# Patient Record
Sex: Female | Born: 1950 | Hispanic: No | Smoking: Never smoker
Health system: Southern US, Community
[De-identification: ages and names within clinical notes are randomized; demographics above are authoritative.]

## PROBLEM LIST (undated history)

## (undated) DIAGNOSIS — F32A Depression, unspecified: Secondary | ICD-10-CM

## (undated) DIAGNOSIS — I10 Essential (primary) hypertension: Secondary | ICD-10-CM

## (undated) DIAGNOSIS — K219 Gastro-esophageal reflux disease without esophagitis: Secondary | ICD-10-CM

## (undated) DIAGNOSIS — F329 Major depressive disorder, single episode, unspecified: Secondary | ICD-10-CM

## (undated) DIAGNOSIS — F319 Bipolar disorder, unspecified: Secondary | ICD-10-CM

## (undated) HISTORY — PX: REPLACEMENT TOTAL KNEE: SUR1224

---

## 2000-12-20 ENCOUNTER — Ambulatory Visit (HOSPITAL_COMMUNITY): Admission: RE | Admit: 2000-12-20 | Discharge: 2000-12-20 | Payer: Self-pay | Admitting: *Deleted

## 2000-12-20 ENCOUNTER — Encounter: Payer: Self-pay | Admitting: *Deleted

## 2000-12-22 ENCOUNTER — Emergency Department (HOSPITAL_COMMUNITY): Admission: EM | Admit: 2000-12-22 | Discharge: 2000-12-22 | Payer: Self-pay | Admitting: Emergency Medicine

## 2016-09-03 ENCOUNTER — Emergency Department (HOSPITAL_COMMUNITY): Payer: Medicare Other

## 2016-09-03 ENCOUNTER — Encounter (HOSPITAL_COMMUNITY): Payer: Self-pay | Admitting: Nurse Practitioner

## 2016-09-03 ENCOUNTER — Emergency Department (HOSPITAL_COMMUNITY)
Admission: EM | Admit: 2016-09-03 | Discharge: 2016-09-03 | Disposition: A | Discharge status: Home / Self Care | Payer: Medicare Other | Attending: Emergency Medicine | Admitting: Emergency Medicine

## 2016-09-03 DIAGNOSIS — Y999 Unspecified external cause status: Secondary | ICD-10-CM | POA: Insufficient documentation

## 2016-09-03 DIAGNOSIS — W19XXXA Unspecified fall, initial encounter: Secondary | ICD-10-CM

## 2016-09-03 DIAGNOSIS — Z91040 Latex allergy status: Secondary | ICD-10-CM | POA: Insufficient documentation

## 2016-09-03 DIAGNOSIS — S0083XA Contusion of other part of head, initial encounter: Secondary | ICD-10-CM | POA: Diagnosis not present

## 2016-09-03 DIAGNOSIS — W0110XA Fall on same level from slipping, tripping and stumbling with subsequent striking against unspecified object, initial encounter: Secondary | ICD-10-CM | POA: Insufficient documentation

## 2016-09-03 DIAGNOSIS — Y939 Activity, unspecified: Secondary | ICD-10-CM | POA: Diagnosis not present

## 2016-09-03 DIAGNOSIS — S8001XA Contusion of right knee, initial encounter: Secondary | ICD-10-CM | POA: Diagnosis not present

## 2016-09-03 DIAGNOSIS — S8991XA Unspecified injury of right lower leg, initial encounter: Secondary | ICD-10-CM | POA: Diagnosis present

## 2016-09-03 DIAGNOSIS — Z96653 Presence of artificial knee joint, bilateral: Secondary | ICD-10-CM | POA: Diagnosis not present

## 2016-09-03 DIAGNOSIS — Y92512 Supermarket, store or market as the place of occurrence of the external cause: Secondary | ICD-10-CM | POA: Insufficient documentation

## 2016-09-03 HISTORY — DX: Gastro-esophageal reflux disease without esophagitis: K21.9

## 2016-09-03 HISTORY — DX: Bipolar disorder, unspecified: F31.9

## 2016-09-03 MED ORDER — OXYCODONE-ACETAMINOPHEN 5-325 MG PO TABS
1.0000 | ORAL_TABLET | Freq: Once | ORAL | Status: AC
  Administered 2016-09-03: 1 via ORAL
  Filled 2016-09-03: qty 1

## 2016-09-03 MED ORDER — LORAZEPAM 0.5 MG PO TABS
0.5000 mg | ORAL_TABLET | Freq: Once | ORAL | Status: AC
  Administered 2016-09-03: 0.5 mg via ORAL
  Filled 2016-09-03: qty 1

## 2016-09-03 MED ORDER — HYDROCODONE-ACETAMINOPHEN 5-325 MG PO TABS
1.0000 | ORAL_TABLET | Freq: Once | ORAL | Status: AC
  Administered 2016-09-03: 1 via ORAL
  Filled 2016-09-03: qty 1

## 2016-09-03 MED ORDER — ONDANSETRON 8 MG PO TBDP
8.0000 mg | ORAL_TABLET | Freq: Once | ORAL | Status: AC
  Administered 2016-09-03: 8 mg via ORAL
  Filled 2016-09-03: qty 1

## 2016-09-03 NOTE — ED Notes (Signed)
Bed: [facility] Expected date:  Expected time:  Means of arrival:  Comments: 

## 2016-09-03 NOTE — ED Triage Notes (Signed)
Pt reportedly had a mechanical fall injuring her back and right knee, also c/o HA.

## 2016-09-03 NOTE — Discharge Instructions (Signed)
Ice and elevate your knee.  I see your chin.  Rest. Continue tylenol for pain. Follow up with family doctor. Return if worsening symptoms.

## 2016-09-03 NOTE — ED Provider Notes (Signed)
[facility]-EMERGENCY DEPT Provider Note   CSN: [id number] Arrival date & time: 09/03/16  1753     History   Chief Complaint Chief Complaint  Patient presents with  . Fall  . Back Pain    HPI Jennifer Choi is a 65 y.o. female.  HPI Jennifer Choi is a 65 y.o. female with hx of bilpolar disorder, GERD, presents to ED with complaint of a fall. Patient states she slipped on something on the floor while grocery shopping, states she fell onto the right side.  She hit her chin on the floor, reports hitting her right shoulder, right hip, right knee.  She states that she did not lose consciousness.  She had a headache initially, it is now easing off.  Her main complaint at this time is right knee pain and chin pain.  Denies any dental injuries.  Denies any nausea or vomiting.  No dizziness.  No amnesia.  No confusion.  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  States pain with ambulation, states came by EMS.  Denies any neck or back pain.  Past Medical History:  Diagnosis Date  . Bipolar 1 disorder (HCC)   . GERD (gastroesophageal reflux disease)     There are no active problems to display for this patient.   Past Surgical History:  Procedure Laterality Date  . REPLACEMENT TOTAL KNEE Bilateral     OB History    No data available       Home Medications    Prior to Admission medications   Not on File    Family History History reviewed. No pertinent family history.  Social History Social History  Substance Use Topics  . Smoking status: Never Smoker  . Smokeless tobacco: Never Used  . Alcohol use Yes     Allergies   Latex; Nsaids; and Sulphur [sulfur]   Review of Systems Review of Systems  Constitutional: Negative for chills and fever.  HENT: Positive for facial swelling.   Respiratory: Negative for cough, chest tightness and shortness of breath.   Cardiovascular: Negative for chest pain, palpitations and leg swelling.  Gastrointestinal: Negative for  abdominal pain, diarrhea, nausea and vomiting.  Genitourinary: Negative for dysuria, flank pain, pelvic pain, vaginal bleeding, vaginal discharge and vaginal pain.  Musculoskeletal: Positive for arthralgias and myalgias. Negative for neck pain and neck stiffness.  Skin: Positive for wound. Negative for rash.  Neurological: Positive for headaches. Negative for dizziness and weakness.  All other systems reviewed and are negative.    Physical Exam Updated Vital Signs BP 141/77   Pulse 88   Resp 18   SpO2 96%   Physical Exam  Constitutional: She appears well-developed and well-nourished. No distress.  HENT:  Head: Normocephalic.  Contusion to the chin.  No mandibular tenderness otherwise.  No trismus.  No dental injury.  No other facial swelling or bruising noted.  Eyes: Conjunctivae are normal.  Neck: Neck supple.  No midline cervical spine tenderness.  No pain with range of motion of the neck.  Cardiovascular: Normal rate, regular rhythm and normal heart sounds.   Pulmonary/Chest: Effort normal and breath sounds normal. No respiratory distress. She has no wheezes. She has no rales.  Abdominal: Soft. Bowel sounds are normal. She exhibits no distension. There is no tenderness. There is no rebound.  Musculoskeletal: She exhibits no edema.  No thoracic or lumbar spine tenderness.  Full range of motion of bilateral hips.  Contusion to the right anterior knee.  Pain with range of motion of  the knee.  Joint is stable and negative anterior-posterior drawer signs.  Normal left knee.  Normal bilateral ankle and feet.  Neurological: She is alert.  Skin: Skin is warm and dry.  Psychiatric: She has a normal mood and affect. Her behavior is normal.  Nursing note and vitals reviewed.    ED Treatments / Results  Labs (all labs ordered are listed, but only abnormal results are displayed) Labs Reviewed - No data to display  EKG  EKG Interpretation None       Radiology Dg Knee Complete 4  Views Right  Result Date: 09/03/2016 CLINICAL DATA:  Fall with anterior and lateral knee pain. EXAM: RIGHT KNEE - COMPLETE 4+ VIEW COMPARISON:  None. 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 No evidence of effusion. No evidence of acute fracture or dislocation. IMPRESSION: No acute findings. Electronically Signed   By: Elberta Fortisaniel  Boyle M.D.   On: 09/03/2016 21:08    Procedures Procedures (including critical care time)  Medications Ordered in ED Medications  oxyCODONE-acetaminophen (PERCOCET/ROXICET) 5-325 MG per tablet 1 tablet (1 tablet Oral Given 09/03/16 1852)  LORazepam (ATIVAN) tablet 0.5 mg (0.5 mg Oral Given 09/03/16 2014)  HYDROcodone-acetaminophen (NORCO/VICODIN) 5-325 MG per tablet 1 tablet (1 tablet Oral Given 09/03/16 2013)     Initial Impression / Assessment and Plan / ED Course  I have reviewed the triage vital signs and the nursing notes.  Pertinent labs & imaging results that were available during my care of the patient were reviewed by me and considered in my medical decision making (see chart for details).  Clinical Course  Patient seen and examined.  Patient with mechanical fall.  Patient did hit her chin, reports fell onto the right side.  No loss of consciousness, not anticoagulated, denies severe headache at this time.  Patient states that she is about to have a panic attack however.  She states panic attacks are typical for her and usually takes Ativan.  Will give Ativan.  I do not and she needs any head imaging at this time patient denies neck or back pain.  No deformities right shoulder, full range of motion of the shoulder.  I do not think she needs any imaging of the shoulder.  Full range of motion of the head.  Will get x-rays of the knee.  Will give Norco for pain.  9:37 PM X-ray of the knee is negative. Pt ambulated in the hallway. Continues to be neurovascularly intact, in NAD. Anxiety  improved. Stable for dc home. RICE therapy. Follow up with pcp.   Vitals:   09/03/16 1758 09/03/16 2134  BP: 141/77 138/79  Pulse: 88 84  Resp: 18 18  SpO2: 96% 98%      Final Clinical Impressions(s) / ED Diagnoses   Final diagnoses:  Fall, initial encounter  Contusion of right knee, initial encounter  Contusion of chin, initial encounter    New Prescriptions New Prescriptions   No medications on file     Jaynie Crumbleatyana Kristilyn Coltrane, PA-C 09/03/16 2138    Mancel BaleElliott Wentz, MD 09/04/16 [phone]

## 2016-09-07 ENCOUNTER — Ambulatory Visit (HOSPITAL_COMMUNITY)
Admission: EM | Admit: 2016-09-07 | Discharge: 2016-09-07 | Disposition: A | Discharge status: Home / Self Care | Payer: Medicare Other | Attending: Emergency Medicine | Admitting: Emergency Medicine

## 2016-09-07 ENCOUNTER — Encounter (HOSPITAL_COMMUNITY): Payer: Self-pay | Admitting: Emergency Medicine

## 2016-09-07 ENCOUNTER — Ambulatory Visit (INDEPENDENT_AMBULATORY_CARE_PROVIDER_SITE_OTHER): Payer: Medicare Other

## 2016-09-07 DIAGNOSIS — S20211A Contusion of right front wall of thorax, initial encounter: Secondary | ICD-10-CM

## 2016-09-07 HISTORY — DX: Essential (primary) hypertension: I10

## 2016-09-07 HISTORY — DX: Major depressive disorder, single episode, unspecified: F32.9

## 2016-09-07 HISTORY — DX: Depression, unspecified: F32.A

## 2016-09-07 MED ORDER — TRAMADOL HCL 50 MG PO TABS: 50.0000 mg | ORAL_TABLET | Freq: Four times a day (QID) | ORAL | 0 refills | Status: AC | PRN

## 2016-09-07 NOTE — ED Triage Notes (Signed)
[facility] a [facility] of right side rib pain secondary to a fall that occurred 4 days ago. The patient [facility] but was not having rib pain at the time.

## 2016-09-07 NOTE — ED Provider Notes (Signed)
CSN: [id number]     Arrival date & time 09/07/16  1808 History   First MD Initiated Contact with Patient 09/07/16 1915     Chief Complaint  Patient presents with  . Rib Injury   (Consider location/radiation/quality/duration/timing/severity/associated sxs/prior Treatment) HPI 65 Y/O FEMALE HAD A FALL 4 DAYS AGO. MULTIPLE BRUISES. NOW WITH RIGHT RIB PAIN WITH RADIATION TO THE BACK. HURTS TO ROLL OVER ON THAT SIDE, AND TAKE DEEP BREATHS.  TYLENOL AT HOME FOR PAIN WITHOUT RESOLUTION. Past Medical History:  Diagnosis Date  . Bipolar 1 disorder (HCC)   . Depression   . GERD (gastroesophageal reflux disease)   . Hypertension    Past Surgical History:  Procedure Laterality Date  . REPLACEMENT TOTAL KNEE Bilateral    History reviewed. No pertinent family history. Social History  Substance Use Topics  . Smoking status: Never Smoker  . Smokeless tobacco: Never Used  . Alcohol use Yes   OB History    No data available     Review of Systems  Denies: HEADACHE, NAUSEA, ABDOMINAL PAIN, CHEST PAIN, CONGESTION, DYSURIA, SHORTNESS OF BREATH  Allergies  Latex; Nsaids; and Sulphur [sulfur]  Home Medications   Prior to Admission medications   Medication Sig Start Date End Date Taking? Authorizing Provider  amLODipine (NORVASC) 10 MG tablet Take 10 mg by mouth daily.   Yes Historical Provider, MD  buPROPion (WELLBUTRIN) 75 MG tablet Take 75 mg by mouth 2 (two) times daily.   Yes Historical Provider, MD  esomeprazole (NEXIUM) 10 MG packet Take 10 mg by mouth daily before breakfast.   Yes Historical Provider, MD  loratadine (CLARITIN) 10 MG tablet Take 10 mg by mouth daily.   Yes Historical Provider, MD  LORazepam (ATIVAN) 0.5 MG tablet Take 0.5 mg by mouth every 8 (eight) hours.   Yes Historical Provider, MD  Triamcinolone Acetonide (NASACORT ALLERGY 24HR NA) Place into the nose.   Yes Historical Provider, MD   Meds Ordered and Administered this Visit  Medications - No data to  display  BP 151/91 (BP Location: Right Arm)   Pulse 85   Temp 98 F (36.7 C) (Oral)   Resp 18   SpO2 100%  No data found.   Physical Exam NURSES NOTES AND VITAL SIGNS REVIEWED. CONSTITUTIONAL: Well developed, well nourished, no acute distress HEENT: normocephalic, atraumatic EYES: Conjunctiva normal NECK:normal ROM, supple, no adenopathy PULMONARY:No respiratory distress, normal effort, CHEST WALL WITHOUT BRUISE, MINIMAL RIB TENDERNESS UNDER RIGHT BREAST. ABDOMINAL: Soft, ND, NT BS+, No CVAT MUSCULOSKELETAL: Normal ROM of all extremities,  SKIN: warm and dry without rash PSYCHIATRIC: Mood and affect, behavior are normal  Urgent Care Course   Clinical Course    Procedures (including critical care time)  Labs Review Labs Reviewed - No data to display  Imaging Review Dg Chest 2 View  Result Date: 09/07/2016 CLINICAL DATA:  Recent fall 3 days ago with right-sided chest pain, initial encounter EXAM: CHEST  2 VIEW COMPARISON:  None. FINDINGS: The heart size and mediastinal contours are within normal limits. Both lungs are clear. The visualized skeletal structures are unremarkable. IMPRESSION: No active cardiopulmonary disease. Electronically Signed   By: Alcide CleverMark  Lukens M.D.   On: 09/07/2016 19:52     Visual Acuity Review  Right Eye Distance:   Left Eye Distance:   Bilateral Distance:    Right Eye Near:   Left Eye Near:    Bilateral Near:         MDM   1. Rib contusion,  right, initial encounter     Patient is reassured that there are no issues that require transfer to higher level of care at this time or additional tests. Patient is advised to continue home symptomatic treatment. Patient is advised that if there are new or worsening symptoms to attend the emergency department, contact primary care provider, or return to UC. Instructions of care provided discharged home in stable condition.    THIS NOTE WAS GENERATED USING A VOICE RECOGNITION SOFTWARE PROGRAM.  ALL REASONABLE EFFORTS  WERE MADE TO PROOFREAD THIS DOCUMENT FOR ACCURACY.  I have verbally reviewed the discharge instructions with the patient. A printed AVS was given to the patient.  All questions were answered prior to discharge.      Tharon Aquas, PA 09/07/16 [phone]

## 2017-12-05 IMAGING — CR DG KNEE COMPLETE 4+V*R*
4 series · 4 of 4 positions shown · non-contrast
Comparison: None.

CLINICAL DATA: Fall with anterior and lateral knee pain.

EXAM:
RIGHT KNEE - COMPLETE 4+ VIEW

[x knee ap right (1 of 3)]
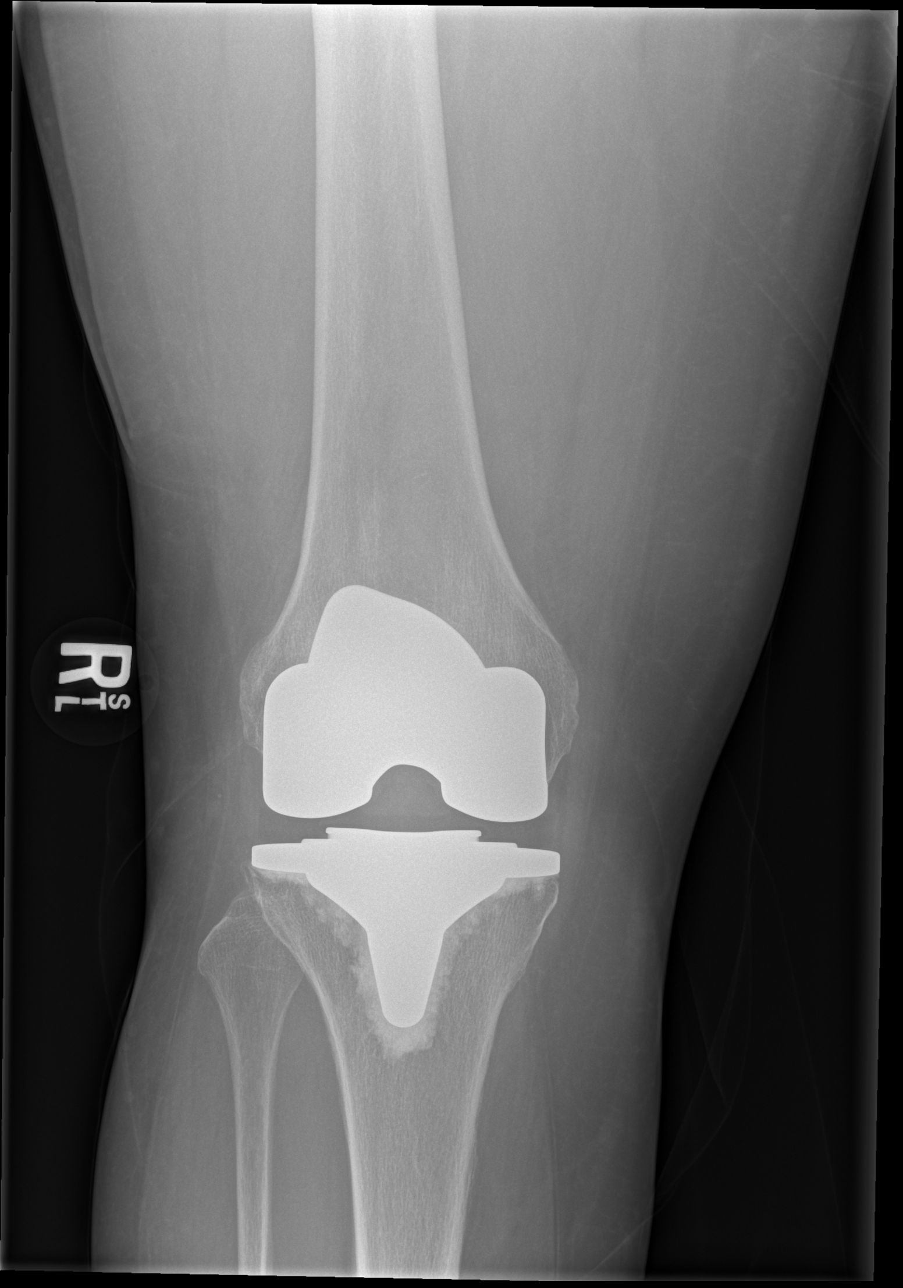

[x knee ap right (2 of 3)]
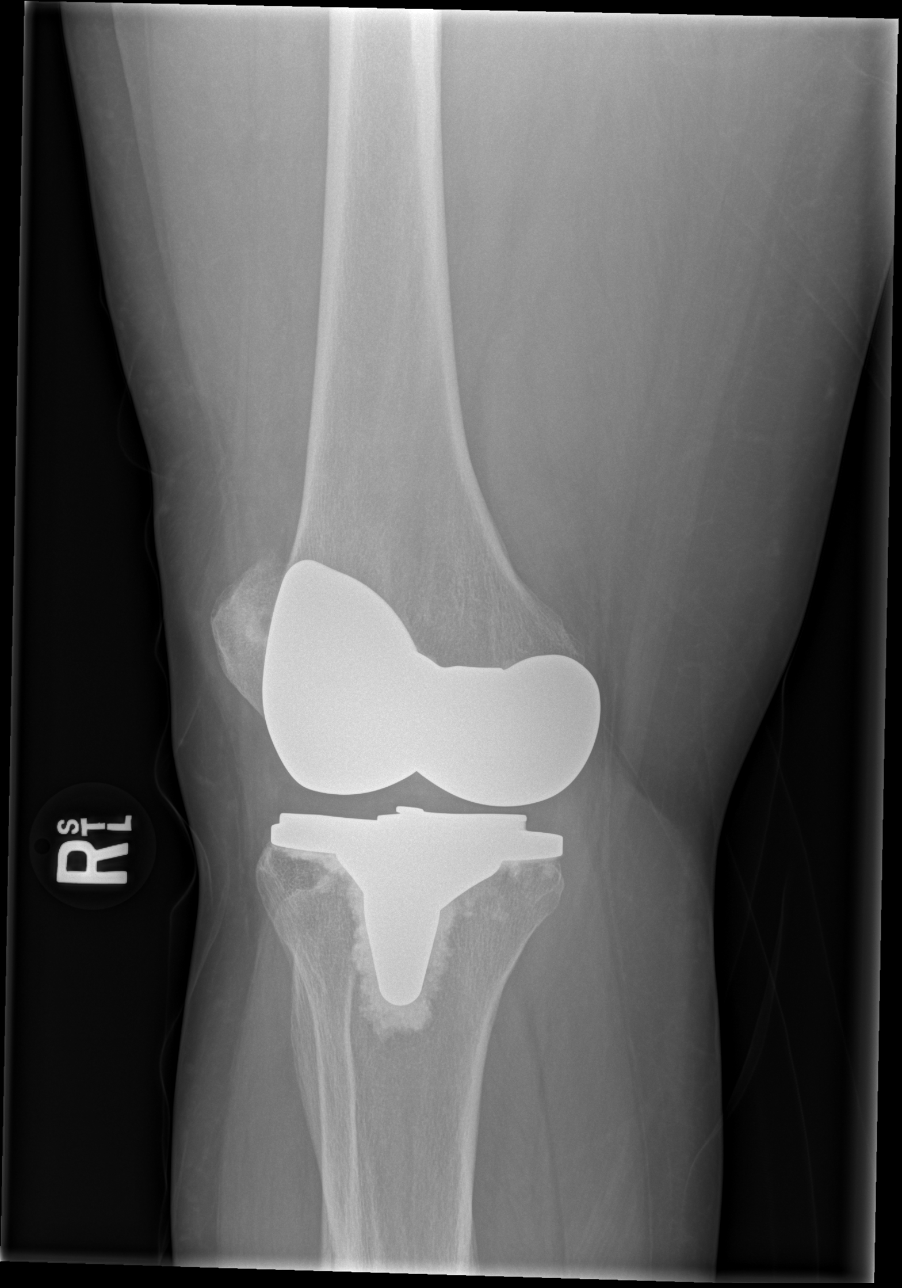

[x knee ap right (3 of 3)]
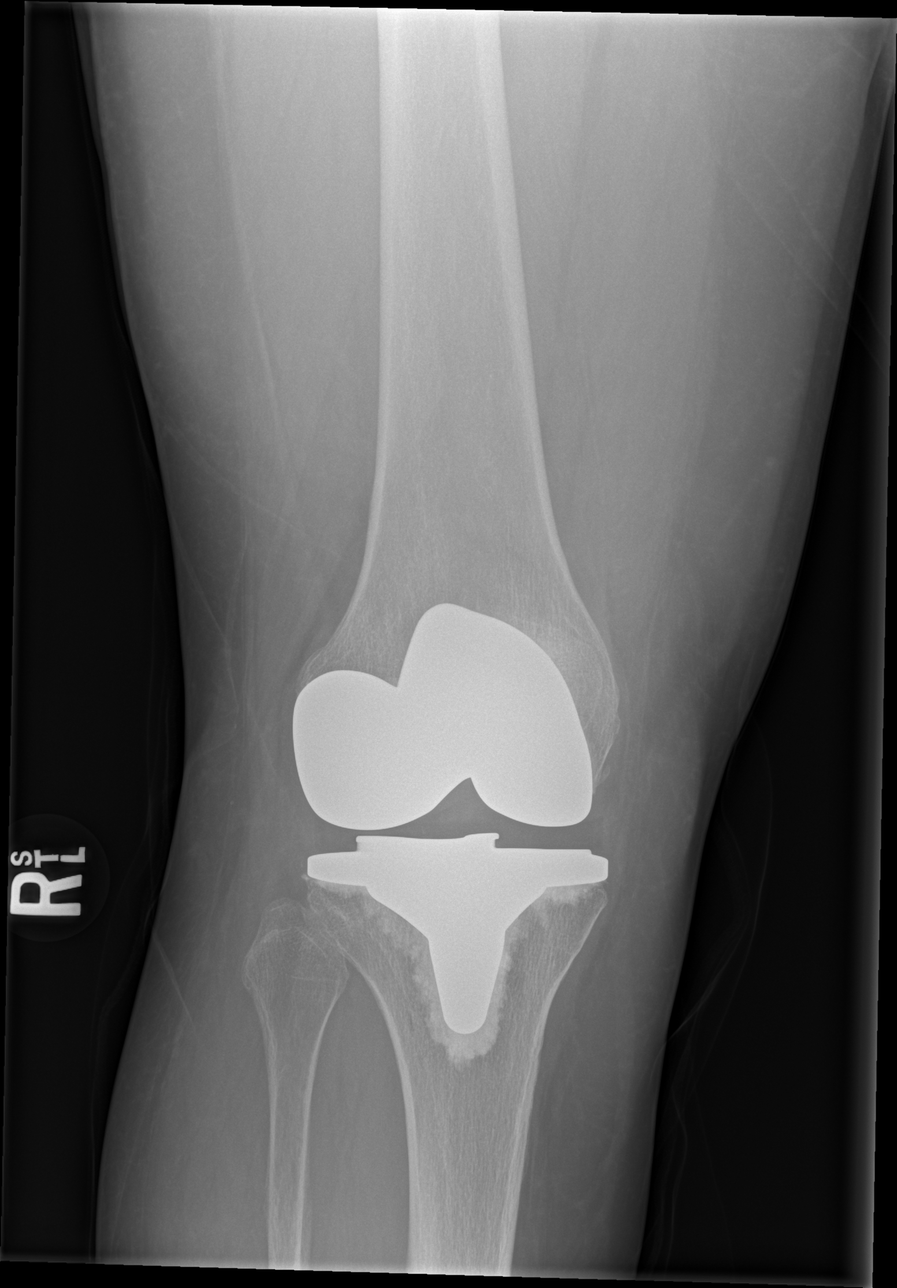

[x knee lat right]
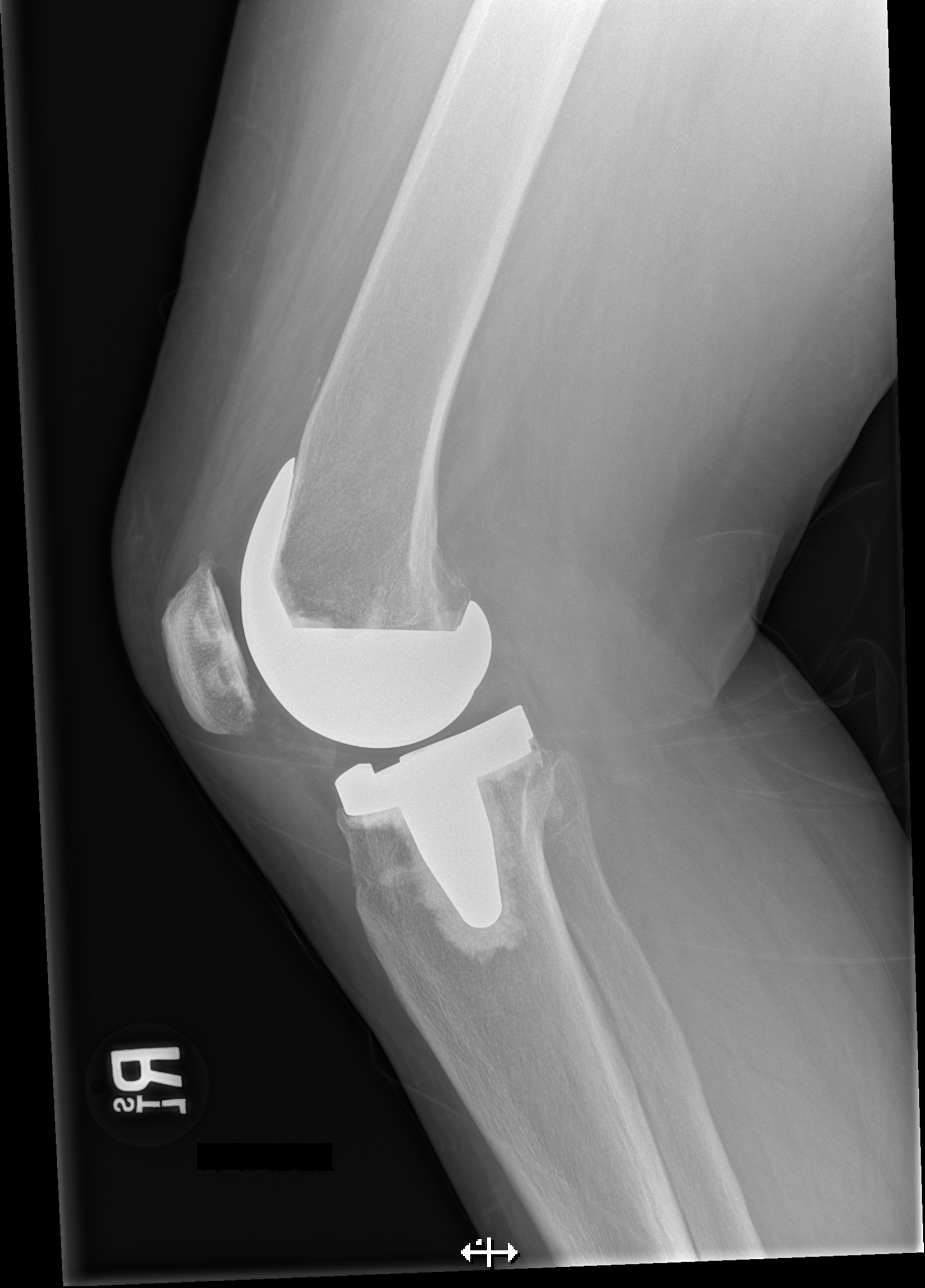

[4 of 4 positions shown; findings below may reference images not displayed]

FINDINGS: Exam demonstrates a total right knee arthroplasty with prosthetic
components intact and normally located. No evidence of effusion. No
evidence of acute fracture or dislocation.
IMPRESSION: No acute findings.
